# Patient Record
Sex: Male | Born: 1955 | Race: Black or African American | Hispanic: No | Marital: Single | Smoking: Current every day smoker
Health system: Southern US, Community
[De-identification: ages and names within clinical notes are randomized; demographics above are authoritative.]

## PROBLEM LIST (undated history)

## (undated) DIAGNOSIS — E0590 Thyrotoxicosis, unspecified without thyrotoxic crisis or storm: Secondary | ICD-10-CM

## (undated) DIAGNOSIS — I10 Essential (primary) hypertension: Secondary | ICD-10-CM

## (undated) HISTORY — PX: LYMPH NODE BIOPSY: SHX201

---

## 1999-11-23 ENCOUNTER — Encounter: Payer: Self-pay | Admitting: Internal Medicine

## 1999-11-23 ENCOUNTER — Encounter: Admission: RE | Admit: 1999-11-23 | Discharge: 1999-11-23 | Payer: Self-pay | Admitting: Internal Medicine

## 2000-01-04 ENCOUNTER — Other Ambulatory Visit: Admission: RE | Admit: 2000-01-04 | Discharge: 2000-01-04 | Payer: Self-pay | Admitting: General Surgery

## 2000-09-08 ENCOUNTER — Emergency Department (HOSPITAL_COMMUNITY): Admission: EM | Admit: 2000-09-08 | Discharge: 2000-09-08 | Payer: Self-pay | Admitting: Emergency Medicine

## 2001-07-03 ENCOUNTER — Emergency Department (HOSPITAL_COMMUNITY): Admission: EM | Admit: 2001-07-03 | Discharge: 2001-07-03 | Payer: Self-pay

## 2002-08-19 ENCOUNTER — Emergency Department (HOSPITAL_COMMUNITY): Admission: EM | Admit: 2002-08-19 | Discharge: 2002-08-19 | Payer: Self-pay | Admitting: *Deleted

## 2005-02-12 ENCOUNTER — Emergency Department (HOSPITAL_COMMUNITY): Admission: EM | Admit: 2005-02-12 | Discharge: 2005-02-13 | Payer: Self-pay | Admitting: Emergency Medicine

## 2005-02-23 ENCOUNTER — Emergency Department (HOSPITAL_COMMUNITY): Admission: EM | Admit: 2005-02-23 | Discharge: 2005-02-23 | Payer: Self-pay | Admitting: Emergency Medicine

## 2007-07-09 ENCOUNTER — Encounter: Admission: RE | Admit: 2007-07-09 | Discharge: 2007-07-09 | Payer: Self-pay | Admitting: Internal Medicine

## 2011-04-18 ENCOUNTER — Emergency Department (HOSPITAL_COMMUNITY): Payer: BC Managed Care – PPO

## 2011-04-18 ENCOUNTER — Observation Stay (HOSPITAL_COMMUNITY)
Admission: EM | Admit: 2011-04-18 | Discharge: 2011-04-19 | DRG: 143 | Disposition: A | Discharge status: Home / Self Care | Payer: BC Managed Care – PPO | Attending: Emergency Medicine | Admitting: Emergency Medicine

## 2011-04-18 DIAGNOSIS — F1010 Alcohol abuse, uncomplicated: Secondary | ICD-10-CM | POA: Diagnosis present

## 2011-04-18 DIAGNOSIS — R7309 Other abnormal glucose: Secondary | ICD-10-CM | POA: Diagnosis present

## 2011-04-18 DIAGNOSIS — Z79899 Other long term (current) drug therapy: Secondary | ICD-10-CM

## 2011-04-18 DIAGNOSIS — I10 Essential (primary) hypertension: Secondary | ICD-10-CM | POA: Diagnosis present

## 2011-04-18 DIAGNOSIS — F1210 Cannabis abuse, uncomplicated: Secondary | ICD-10-CM | POA: Diagnosis present

## 2011-04-18 DIAGNOSIS — F17200 Nicotine dependence, unspecified, uncomplicated: Secondary | ICD-10-CM | POA: Diagnosis present

## 2011-04-18 DIAGNOSIS — Z8249 Family history of ischemic heart disease and other diseases of the circulatory system: Secondary | ICD-10-CM

## 2011-04-18 DIAGNOSIS — R0789 Other chest pain: Principal | ICD-10-CM | POA: Diagnosis present

## 2011-04-18 LAB — POCT CARDIAC MARKERS
CKMB, poc: 10.4 ng/mL (ref 1.0–8.0)
Myoglobin, poc: 191 ng/mL (ref 12–200)
Troponin i, poc: <0.05 ng/mL (ref 0.00–0.09)

## 2011-04-18 LAB — LIPID PANEL
HDL: 61 mg/dL (ref >39)
Triglycerides: 59 mg/dL (ref <150)
VLDL: 12 mg/dL (ref 0–40)

## 2011-04-18 LAB — CARDIAC PANEL(CRET KIN+CKTOT+MB+TROPI)
CK, MB: 12.2 ng/mL (ref 0.3–4.0)
Relative Index: 1.2 (ref 0.0–2.5)
Troponin I: <0.3 ng/mL (ref <0.30)

## 2011-04-18 LAB — CBC
HCT: 45.9 % (ref 39.0–52.0)
MCV: 83 fL (ref 78.0–100.0)
RBC: 5.53 MIL/uL (ref 4.22–5.81)
WBC: 5.4 10*3/uL (ref 4.0–10.5)

## 2011-04-18 LAB — BASIC METABOLIC PANEL
BUN: 13 mg/dL (ref 6–23)
Calcium: 9.6 mg/dL (ref 8.4–10.5)
GFR calc non Af Amer: >60 mL/min (ref >60)
Glucose, Bld: 95 mg/dL (ref 70–99)
Potassium: 3.9 mEq/L (ref 3.5–5.1)
Sodium: 137 mEq/L (ref 135–145)

## 2011-04-18 LAB — DIFFERENTIAL
Basophils Absolute: 0 10*3/uL (ref 0.0–0.1)
Lymphocytes Relative: 31 % (ref 12–46)
Lymphs Abs: 1.7 10*3/uL (ref 0.7–4.0)
Neutro Abs: 3.1 10*3/uL (ref 1.7–7.7)
Neutrophils Relative %: 57 % (ref 43–77)

## 2011-04-19 DIAGNOSIS — R0789 Other chest pain: Secondary | ICD-10-CM

## 2011-04-19 DIAGNOSIS — R7989 Other specified abnormal findings of blood chemistry: Secondary | ICD-10-CM

## 2011-04-19 LAB — BASIC METABOLIC PANEL
BUN: 19 mg/dL (ref 6–23)
CO2: 23 mEq/L (ref 19–32)
Chloride: 104 mEq/L (ref 96–112)
Creatinine, Ser: 0.77 mg/dL (ref 0.4–1.5)
Glucose, Bld: 93 mg/dL (ref 70–99)
Potassium: 3.8 mEq/L (ref 3.5–5.1)

## 2011-04-19 LAB — HEMOGLOBIN A1C: Hgb A1c MFr Bld: 6.3 % — ABNORMAL HIGH (ref <5.7)

## 2011-04-19 LAB — CARDIAC PANEL(CRET KIN+CKTOT+MB+TROPI)
CK, MB: 9.5 ng/mL (ref 0.3–4.0)
Total CK: 643 U/L — ABNORMAL HIGH (ref 7–232)
Troponin I: <0.3 ng/mL (ref <0.30)
Troponin I: <0.3 ng/mL (ref <0.30)

## 2011-04-19 LAB — TSH: TSH: <0.008 u[IU]/mL — ABNORMAL LOW (ref 0.350–4.500)

## 2011-04-19 LAB — CBC
MCH: 28.7 pg (ref 26.0–34.0)
MCV: 84.2 fL (ref 78.0–100.0)
Platelets: 226 10*3/uL (ref 150–400)
RBC: 5.06 MIL/uL (ref 4.22–5.81)
RDW: 13.7 % (ref 11.5–15.5)

## 2011-04-19 LAB — RAPID URINE DRUG SCREEN, HOSP PERFORMED
Amphetamines: NOT DETECTED
Barbiturates: NOT DETECTED
Benzodiazepines: NOT DETECTED
Tetrahydrocannabinol: POSITIVE — AB

## 2011-04-19 NOTE — H&P (Signed)
Ralph Bradshaw, Ralph Bradshaw           ACCOUNT NO.:  [id number]  MEDICAL RECORD NO.:  [id number]           PATIENT TYPE:  E  LOCATION:  [facility]                         FACILITY:  [facility]  PHYSICIAN:  Clydia Llano, MD       DATE OF BIRTH:  11-26-1956  DATE OF ADMISSION:  04/18/2011 DATE OF DISCHARGE:                             HISTORY & PHYSICAL   PRIMARY CARE PHYSICIAN:  [facility].  REASON FOR ADMISSION:  Chest pain.  HISTORY OF PRESENT ILLNESS:  Mr. Ralph Bradshaw is a 55 year old African American male with no significant past medical history.  The patient presented to the hospital with chest pain.  The patient had chest pain on and off for the past few days.  Today the chest pressure did not go off.  It is more constant.  It is 5/6 substernal.  He did not feel anything increases or decrease the pain.  It is not exertional.  The pain when he came into the ED was 1/10.  When I interviewed him on pain, he is chest pain free.  The patient denies any shortness of breath. Denies syncope, diaphoresis, cough, or wheezes.  Upon initial evaluation in the emergency department, the patient's CK-MB is slightly positive at 10.4 with EKG still negative.  PAST MEDICAL HISTORY:  Hypertension.  SOCIAL HISTORY:  The patient lives in [place] here.  He lives in the same house with his mother who does have congestive heart failure and he cares for her.  The patient smokes 1 pack per day for the past 30 years, is not a chronic drinker, has a remote history of recreational drug use.  FAMILY HISTORY:  Mother has congestive heart failure.  She is still alive at 17.  Father died at 69 secondary to heart problems.  ALLERGIES:  NKDA.  MEDICATIONS:  Unable to remember medications.  REVIEW OF SYSTEMS:  GENERAL:  Negative for fever.  EYES:  Denies visual changes.  ENT:  Denies blurry vision.  CARDIOVASCULAR:  Positive for chest pain.  RESPIRATORY:  Denies cough, wheezes.  GASTROINTESTINAL: Denies  nausea, vomiting, or diarrhea.  MUSCULOSKELETAL:  Denies arthralgia.  SKIN:  Denies rash.  NEURO:  Denies headache.  HEMATOLOGY: Denies easy bruising or recent blood transfusion.  GU:  Denies dysuria or urgency.  PHYSICAL EXAMINATION:  VITAL SIGNS:  Temperature is 98.4, respirations 20, pulse 90, blood pressure 130/84. GENERAL:  The patient is a well-developed, well-nourished African American male, wearing hospital gown, lying on his back in no acute distress. HEENT:  Head and face:  Normocephalic, atraumatic.  Eyes:  Pupils are equal, reactive to light and accommodation.  Normal sclerae.  Ears, nose and throat:  Normal.  Mouth without oral thrush or lesions. NECK:  Supple, full range of motion.  No meningeal signs.  No JVD appreciated. CARDIOVASCULAR:  Regular rate and rhythm.  No murmur, rubs, or gallops. RESPIRATORY:  Clear to auscultation bilaterally. CHEST:  Nontender, symmetrical to breathing bilaterally. ABDOMEN:  Bowel sounds heard.  Soft, nontender, or distended.  No hepatosplenomegaly appreciated. EXTREMITIES:  Normal.  No deformities.  No pedal edema. NEURO:  Alert, awake, oriented x3.  Cranial nerves II-XII  grossly intact.  Motor intact in all extremities. SKIN:  Color normal.  No rash.  Warm and dry. PSYCH:  Alert, awake, oriented x4.  No abnormality of mood or affect.  Cardiac EKG showed normal sinus rhythm rate at 89.  Chest x-ray showed no active lung disease, questionable chronic bronchitis.  LABORATORY DATA: 1. BMP:  Sodium 137, potassium 3.9, chloride 101, bicarb is 24,     glucose 95, BUN is 13, creatinine is 0.8, calcium 9.6. 2. CBC:  WBC is 5.4, hemoglobin 16.1, hematocrit is 45.9, platelets     256,000. 3. Cardiac enzymes:  CK-MB is 10.4, troponin less than 0.05, myoglobin     is 191.  ASSESSMENT/PLAN: 1. Chest pain.  The patient will be referred to observation overnight.     He will be admitted to cardiac telemetry bed.  Serial cardiac     enzymes will  be obtained, also repeat of the EKG in the morning.     The patient because of slightly positive CK-MB will be staying in     the hospital.  The patient also has positive family history of     premature coronary artery disease.  We will check fasting lipid     profile. 2. Hypertension.  Continue home medications.  Hypertension seems     controlled now.     Clydia Llano, MD     ME/MEDQ  D:  04/18/2011  T:  04/18/2011  Job:  [id number]  cc:   [facility] Roderic Palau R. Renae Gloss, M.D.  Electronically Signed by Clydia Llano  on 04/19/2011 12:44:33 PM

## 2011-04-20 ENCOUNTER — Ambulatory Visit (INDEPENDENT_AMBULATORY_CARE_PROVIDER_SITE_OTHER): Payer: BC Managed Care – PPO | Admitting: Internal Medicine

## 2011-04-20 DIAGNOSIS — R079 Chest pain, unspecified: Secondary | ICD-10-CM

## 2011-04-20 NOTE — Progress Notes (Signed)
Exercise Treadmill Test  Pre-Exercise Testing Evaluation Rhythm: normal sinus  Rate: 88   PR:  .17 QRS:  .09  QT:  .36 QTc: .44     Test  Exercise Tolerance Test Ordering MD: Arvilla Meres, MD  Interpreting MD:  Arvilla Meres, MD  Unique Test No: 1  Treadmill:  1  Indication for ETT: chest pain - rule out ischemia  Contraindication to ETT: No   Stress Modality: exercise - treadmill  Cardiac Imaging Performed: non   Protocol: standard Bruce - maximal  Max BP:  202/98  Max MPHR (bpm):  167 141  MPHR obtained (bpm):  144 % MPHR obtained:  86  Reached 85% MPHR (min:sec):  9:30 Total Exercise Time (min-sec):  10:30  Workload in METS:  13.4 Borg Scale: 15  Reason ETT Terminated:  fatigue    ST Segment Analysis At Rest: normal ST segments - no evidence of significant ST depression With Exercise: no evidence of significant ST depression  Other Information Arrhythmia:  No Angina during ETT:  absent (0) Quality of ETT:  diagnostic  ETT Interpretation:  normal - no evidence of ischemia by ST analysis  Comments: Stage I advanced after 1:30 so total Bruce time 12:00. Normal exercise stress test with no evidence of ischemia. Excellent exercise tolerance. Mild HTN response to exercise.  Recommendations: Risk factor modification.

## 2011-05-09 NOTE — Consult Note (Signed)
[name], Ralph Bradshaw           ACCOUNT NO.:  [id number]  MEDICAL RECORD NO.:  [id number]           PATIENT TYPE:  I  LOCATION:  3704                         FACILITY:  [facility]  PHYSICIAN:  Ralph Bradshaw. Ralph Bradshaw, MDDATE OF BIRTH:  12-09-1956  DATE OF CONSULTATION:  04/19/2011 DATE OF DISCHARGE:  04/19/2011                                CONSULTATION   PRIMARY CARE PHYSICIAN:  Ralph Bradshaw (PA for Ralph Bradshaw. Ralph Gloss, MD).  The patient is new to Cardiology.  REASON FOR CONSULTATION:  Chest discomfort with question of elevated cardiac enzymes.  HISTORY OF PRESENT ILLNESS:  Mr. Ralph Bradshaw is a 55 year old African American male with no cardiac history, but risk factors including hypertension, ongoing tobacco abuse, and question of positive family history, who presents with chest pain over the last 2 weeks, subsequently found to have significantly elevated CK and mildly elevated MB without positive relative index, normal troponins, and no EKG changes.  The patient lives in his usual state of health until approximately 2 weeks prior to his presentation when he noticed intermittent substernal chest pressure, lasting a few minutes that would resolve on its own.  No clear aggravating or alleviating factors.  The chest pain progressed into a near constant discomfort, lasting approximately last week prior to his presentation.  Again, no clear aggravating or alleviating factors, also no associated symptoms.  The patient then began to notice sharp shooting chest pain, lasting a fraction of a second and became concerned enough about these symptoms to present to his primary care physician, PA, Ralph Bradshaw, who completed an EKG without acute changes, but was concerned enough to feel the patient require evaluation in the emergency department.  The patient did present to [facility] ED where he was subsequently admitted and cardiac enzymes have been cycled x2 full sets. Initial set shows CK of 1027 with MB  of 12.2 and relative index of 1.2, troponin less than 0.30.  Second full set showed CK 682 with MB of 9.0 and troponin down to less than 0.30 again.  EKG was completed on admission that shows likely left ventricular hypertrophy but no significant ST-T-wave changes or significant Q-waves, normal axis and normal intervals.  The patient's vital signs have been stable, notable only for mild hypertension with peak systolic at 155 and peak diastolic at 90.  Review of telemetry shows normal sinus rhythm with a rare PVC. Chest x-ray shows chronic bronchitis.  Labs only remarkable other than cardiac enzymes for HbA1c of 6.3%, TSH low at 0.008, and UDS positive for tetrahydrocannabinol.  Lipids with mildly elevated LDL at 111, otherwise within normal limits.  Currently, the patient says his chest discomfort is gone although he still has the sensation present, but no actual discomfort.  The patient does a very physical job and has had no aggravation or alleviation to his chest discomfort at work.  He does note that work has been especially busy starting on to the first week in April and has contributed to increase stress along with some social stresses.  PAST MEDICAL HISTORY: 1. Hypertension (moderate control on 1 medication only). 2. Ongoing tobacco abuse (30 pack years, 1 ppd currently). 3.  EtOH abuse up to 12 drinks a day at least once per week. 4. Marijuana use (UDS positive for marijuana).  SOCIAL HISTORY:  The patient lives in [place] with his mom.  He has a son.  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  He has 30-pack-year smoking history, currently smoking 1 ppd, frequent EtOH use with 12 drinks per day at least once per week. Marijuana use on occasion.  No other illicit drugs.  No herbal meds. Regular diet.  No regular exercise.  FAMILY HISTORY:  Mother is living at age 71 with congestive heart failure.  Father deceased at 35 with some heart  problem, question exact cardiac issue.  No other significant family history.  REVIEW OF SYSTEMS:  Please see HPI.  All other systems were reviewed and were negative.  Specifically, no exertional chest pain, no shortness of breath, no dyspnea on exertion, no orthopnea, no lower extremity edema, no palpitations, no pre/syncope, no nausea/vomiting or abdominal discomfort, no fevers/chills, or any other recent changes.  CODE STATUS:  Full.  ALLERGIES:  NKDA.  MEDICATIONS: 1. Enteric-coated aspirin 325 mg p.o. daily. 2. Toprol 25 mg p.o. daily. 3. P.r.n. meds.  PHYSICAL EXAMINATION:  VITAL SIGNS:  T-max 98.3 degrees Fahrenheit, current temperature 97.6 degrees Fahrenheit, with BP ranging from 128- 155/79-90, with pulse 74-88, respiration rate 18, O2 saturation 99% on 2 liters nasal cannula. GENERAL:  He is alert and oriented x3, in no apparent stress, able to speak very easily in full sentences without respiratory distress, able to move during exam easily without respiratory distress. HEENT:  Head is normocephalic, atraumatic.  Pupils equal, round, and reactive to light.  Extraocular muscles are intact.  Dentition is fair. Oropharynx is without erythema or exudates. NECK:  Supple without lymphadenopathy.  No thyromegaly, no JVD. HEART:  Rate is regular with audible S1 and S2.  No clicks, rubs, murmurs, or gallops.  Pulses are 2+ and equal in both upper and lower extremities bilaterally. LUNGS:  Clear to auscultation bilaterally. SKIN:  No rashes, lesions, or petechiae. ABDOMEN:  Soft, nontender, nondistended.  Normal abdominal bowel sounds. No rebound or guarding. EXTREMITIES:  Without clubbing, cyanosis, or edema. MUSCULOSKELETAL:  Without joint deformity or effusions.  No spinal or CVA tenderness.  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 NEUROLOGIC: Cranial nerves II-XII grossly intact.  Strength is 5/5 in all  extremities and axial groups.  Normal sensation throughout and normal cerebellar function.  RADIOLOGY:  Question chronic, question bronchitis.  No acute changes. EKG, normal sinus rhythm with a rate of 89.  No significant ST-T-wave changes, question Q-wave in V1, question significance.  Normal axis. Left ventricular hypertrophy, PR 160, QRS 96, and QTc of 440 when compared to a prior tracing from February 12, 1999 ? exact year, no significant change except for rate has decreased.  LABS:  WBC is 4.4, HGB 14.5, HCT 42.6, PLT count 226.  Sodium 137, potassium 3.8, chloride 104, bicarb 23, BUN 19, creatinine 0.77, glucose 93.  Hemoglobin A1c 6.3%.  Point-of-care markers negative x1.  First full set of cardiac enzymes; CK 1027, MB 12.2, troponin less than 0.3. Second full set; CK 682, MB 9.0, troponin I less than 0.3.  Positive for tetrahydrocannabinol.  Total cholesterol 184, triglycerides 59, HDL 61, LDL 111.  ASSESSMENT AND PLAN:  The patient was initially seen by Lenard Galloway, PA- C,  and discussed but not yet seen and thoroughly assessed by attending cardiologist, Dr. Arvilla Meres.  Please see his addendum for any significant change to assessment and plan.  Mr. Trompeter is a 55 year old African American male with no known cardiac history but risk factors of hypertension, ongoing tobacco abuse, question positive family history, also history significant for EtOH abuse and marijuana use, who presents with atypical chest discomfort with no objective evidence of cardiac etiology.  1. Chest pain.  The patient's chest pain was some typical, mostly     atypical features.  No objective evidence of cardiac etiology,     specifically significant elevated CK but with negative relative     index in regard to the mildly elevated MB levels.  Troponin I is     negative x3 including point-of-cares.  EKG without acute changes.     The patient has very physical work that has been increasing      recently and could account for his elevated CK.  The patient also     has EtOH abuse which could be potential source of gastrointestinal     chest discomfort.  At any rate, I would recommend outpatient     exercise treadmill stress test.  We will review with Dr. Gala Romney     prior to scheduling. 2. Tobacco abuse - discussed with the patient the vital importance of     tobacco cessation both in regard to long-term help and short term     well-being.  While the patient very likely does not have     significant coronary artery disease currently, he has been informed     that it will be amount of time should he continued to smoke. 3. EtOH abuse.  The patient was instructed/informed as to the     importance of EtOH cessation.  It is clear that at the level of     alcohol consumption that he admits to the patient has significant     reliance on EtOH as a coping mechanism.  The patient instructed as     to the futility of attempts at moderation given his history.  The     patient was offered support and suggestions for success with     abstinence.  The patient is open to alcoholic anonymous and he is     willing to consider EtOH cessation. 4. Depressed TSH - per primary per primary team, would follow up as an     outpatient with repeat TSH, free T4, and free T3.  ADDENDUM:  Case discussed with Dr. Gala Romney who feels the patient does have some typical features and discussed the option of cardiac catheterization with the patient which he refused.  Dr. Gretchen Portela in this setting to proceed with outpatient exercise treadmill stress test and insist this be done as soon as possible and therefore I will make sure this is done tomorrow.  Thank you for the consult.  The patient may be discharged home per Cardiology with date and time of stress test written in the discharge sheet.     Ralph Bradshaw, PAC   ______________________________ Ralph Bradshaw. Ka Flammer, MD    MS/MEDQ  D:   04/19/2011  T:  04/20/2011  Job:  [id number]  Electronically Signed by Ralph Bradshaw PAC on 04/25/2011 01:25:05 PM Electronically Signed by Arvilla Meres MD on 05/09/2011 09:36:24 PM

## 2011-07-13 NOTE — Discharge Summary (Signed)
NAMELORANCE, PICKERAL           ACCOUNT NO.:  [id number]  MEDICAL RECORD NO.:  [id number]           PATIENT TYPE:  O  LOCATION:  3704                         FACILITY:  [facility]  PHYSICIAN:  Baltazar Najjar, MD     DATE OF BIRTH:  Nov 26, 1956  DATE OF ADMISSION:  04/18/2011 DATE OF DISCHARGE:                              DISCHARGE SUMMARY   FINAL DISCHARGE DIAGNOSES: 1. Chest pain with elevated cardiac enzymes. 2. Hypertension. 3. History of hyperthyroidism. 4. Borderline diabetes mellitus type 2.  CONSULTATION:  [facility].  The patient was seen by Dr. Gala Romney.  RADIOLOGY/IMAGING:  Chest x-ray showed no active lung disease, questionable bronchitis, possibly chronic.  BRIEF ADMITTING HISTORY:  Please refer to H and P for more details.  On summary Ralph Bradshaw is a 55 year old man with history of hypertension and he is a smoker, presented to the ER with chest pain which he described as substernal, current pain 5/6.  Improving with nitroglycerin.  HOSPITAL COURSE: 1. The patient's EKG was unremarkable, however, his CK and CK-MB were     elevated.  Cardiology Service was consulted.  The patient was seen     by [facility], who recommended stress test as an outpatient     which was arranged for tomorrow and they cleared the patient for     discharge to follow with them in the office in the a.m. 2. Hypertension.  The patient was continued on his outpatient     metoprolol.  His blood pressure is fairly controlled. 3. Borderline diabetes mellitus type 2.  His hemoglobin A1c is 6.3%.     The patient advised to stay on low-carbohydrate diet and to follow     with his PCP for monitoring and any further management of diabetes,     if not improved by diet alone. 4. Hyperthyroidism.  The patient's labs showed low TSH.  His level was     less than 0.008, however, the patient is completely asymptomatic.     As per him, he was taking medication in the past for his  thyroid.     He was unable to specify the name and he stated that his doctor     took him off those medicines.  I will defer any further management     to his PCP.  He will probably need workup for his hyperthyroidism     including free T3 and T4 level and I will defer that to his PCP.     The patient is currently asymptomatic.  DISCHARGE MEDICATIONS: 1. Aspirin 325 mg p.o. daily. 2. Metoprolol 25 mg p.o. daily.  DISCHARGE INSTRUCTIONS: 1. The patient to follow with Dr. Gala Romney at [facility]     tomorrow as arranged.  He has an appointment at 10:15 a.m. 2. The patient to follow with Dr. [name], his PCP for further     workup and management for his hyperparathyroidism and also to     monitor his borderline diabetes. 3. The patient counseled on diet and advised to stay on low-carb     hydrate diet and he was also counseled on smoking cessation and  other lifestyle modification. 4. The patient advised to report any worsening of symptoms or any new     symptoms to his PCP or come back to the ED if needed.  CONDITION ON DISCHARGE:  Stable.          ______________________________ Baltazar Najjar, MD     SA/MEDQ  D:  04/19/2011  T:  04/19/2011  Job:  [id number]  cc:   [facility] [name]  Electronically Signed by Hannah Beat MD on 07/13/2011 02:40:25 PM

## 2014-12-07 ENCOUNTER — Encounter: Payer: Self-pay | Admitting: Internal Medicine

## 2019-06-04 ENCOUNTER — Encounter (HOSPITAL_COMMUNITY): Payer: Self-pay | Admitting: Emergency Medicine

## 2019-06-04 ENCOUNTER — Emergency Department (HOSPITAL_COMMUNITY)
Admission: EM | Admit: 2019-06-04 | Discharge: 2019-06-04 | Disposition: A | Discharge status: Home / Self Care | Payer: BC Managed Care – PPO | Attending: Emergency Medicine | Admitting: Emergency Medicine

## 2019-06-04 DIAGNOSIS — J1289 Other viral pneumonia: Secondary | ICD-10-CM | POA: Diagnosis not present

## 2019-06-04 DIAGNOSIS — U071 COVID-19: Secondary | ICD-10-CM | POA: Diagnosis not present

## 2019-06-04 DIAGNOSIS — R509 Fever, unspecified: Secondary | ICD-10-CM | POA: Diagnosis present

## 2019-06-04 DIAGNOSIS — J189 Pneumonia, unspecified organism: Secondary | ICD-10-CM

## 2019-06-04 MED ORDER — AMOXICILLIN 500 MG PO CAPS: 500.0000 mg | ORAL_CAPSULE | Freq: Three times a day (TID) | ORAL | 0 refills | Status: DC

## 2019-06-04 MED ORDER — AZITHROMYCIN 250 MG PO TABS: 250.0000 mg | ORAL_TABLET | Freq: Every day | ORAL | 0 refills | Status: DC

## 2019-06-04 MED ORDER — ACETAMINOPHEN 325 MG PO TABS
650.0000 mg | ORAL_TABLET | Freq: Once | ORAL | Status: AC
  Administered 2019-06-04: 650 mg via ORAL
  Filled 2019-06-04: qty 2

## 2019-06-04 MED ORDER — ASPIRIN 81 MG PO CHEW: 81.0000 mg | CHEWABLE_TABLET | Freq: Every day | ORAL | Status: DC

## 2019-06-04 NOTE — ED Triage Notes (Signed)
TC to PT home left message that home meds will be at [facility] desk for pick up.

## 2019-06-04 NOTE — ED Triage Notes (Signed)
PT reports HA started last night . Pt reports he has felt hot mainly at night. Pt had a elevated temp at Triage.

## 2019-06-04 NOTE — ED Triage Notes (Signed)
[facility] arrives from [facility] after having a xray that showed bilateral infiltrates- pt was requesting a covid test. Pt denies any sob or chest pain

## 2019-06-04 NOTE — Discharge Instructions (Signed)
Tylenol/Ibuprofen for any fever.   Self isolate until all tests are back.  We will call with any positive results.

## 2019-06-04 NOTE — ED Notes (Signed)
Patient verbalizes understanding of discharge instructions . Opportunity for questions and answers were provided . Armband removed by staff ,Pt discharged from ED. W/C  offered at D/C  and Declined W/C at D/C and was escorted to lobby by RN.  

## 2019-06-04 NOTE — ED Provider Notes (Signed)
[facility] EMERGENCY DEPARTMENT Provider Note   CSN: [id number] Arrival date & time: 06/04/19  1305    History   Chief Complaint Chief Complaint  Patient presents with  . Fever    HPI Nathyn Inclan is a 63 y.o. male presenting to the ED requesting a covid test.   Pt states that for the past 3 days he has had subjective fever at night and then chills and myalgias during the day.   Today he developed a mild ha so he went to UC.   UC did a CXR that showed bilateral infiltrates and sent pt to ED for covid test.  Pt is heavy smoker and has PMH of HTN.   He is speaking in full sentences and in no distress.   He denies dizziness, neck pain, cp, shob, abd pain, n/v/d.   His spo2 is 96% on RA.       HPI  History reviewed. No pertinent past medical history.  There are no active problems to display for this patient.   History reviewed. No pertinent surgical history.      Home Medications    Prior to Admission medications   Medication Sig Start Date End Date Taking? Authorizing Provider  amoxicillin (AMOXIL) 500 MG capsule Take 1 capsule (500 mg total) by mouth 3 (three) times daily. 06/04/19   Kemia Wendel K, PA-C  aspirin 81 MG chewable tablet Chew 1 tablet (81 mg total) by mouth daily. 06/04/19   Di Jasmer K, PA-C  azithromycin (ZITHROMAX) 250 MG tablet Take 1 tablet (250 mg total) by mouth daily. Take first 2 tablets together, then 1 every day until finished. 06/04/19   Holger Sokolowski K, PA-C    Family History No family history on file.  Social History Social History   Tobacco Use  . Smoking status: Not on file  Substance Use Topics  . Alcohol use: Not on file  . Drug use: Not on file     Allergies   Patient has no allergy information on record.   Review of Systems Review of Systems  Constitutional: Positive for chills and fever.  HENT: Negative for congestion, ear pain, rhinorrhea, sinus pain and sore throat.   Eyes: Negative for pain and  discharge.  Respiratory: Negative for cough, chest tightness and shortness of breath.   Cardiovascular: Negative for chest pain.  Gastrointestinal: Negative for abdominal pain, diarrhea, nausea and vomiting.  Musculoskeletal: Positive for myalgias.  Skin: Negative for color change and rash.  Neurological: Positive for headaches. Negative for dizziness.     Physical Exam Updated Vital Signs BP (!) 149/86 (BP Location: Left Arm)   Pulse 96   Temp (!) 101 F (38.3 C) (Oral)   Resp 18   SpO2 99%   Physical Exam Vitals signs and nursing note reviewed.  Constitutional:      General: He is not in acute distress.    Appearance: Normal appearance. He is not diaphoretic.  HENT:     Head: Normocephalic and atraumatic.     Nose: Nose normal.     Mouth/Throat:     Mouth: Mucous membranes are moist.     Pharynx: Oropharynx is clear. No oropharyngeal exudate or posterior oropharyngeal erythema.  Eyes:     General:        Right eye: No discharge.        Left eye: No discharge.  Neck:     Musculoskeletal: Normal range of motion and neck supple. No neck rigidity or  muscular tenderness.  Cardiovascular:     Rate and Rhythm: Normal rate and regular rhythm.     Pulses: Normal pulses.     Heart sounds: Normal heart sounds.  Pulmonary:     Effort: Pulmonary effort is normal. No respiratory distress.     Breath sounds: No wheezing or rales.  Abdominal:     General: Abdomen is flat. There is no distension.     Palpations: Abdomen is soft.     Tenderness: There is no abdominal tenderness.  Skin:    General: Skin is warm.  Neurological:     General: No focal deficit present.     Mental Status: He is alert and oriented to person, place, and time.     Coordination: Coordination normal.  Psychiatric:        Mood and Affect: Mood normal.        Behavior: Behavior normal.      ED Treatments / Results  Labs (all labs ordered are listed, but only abnormal results are displayed) Labs  Reviewed  NOVEL CORONAVIRUS, NAA (HOSPITAL ORDER, SEND-OUT TO REF LAB)    EKG None  Radiology No results found.  Procedures Procedures (including critical care time)  Medications Ordered in ED Medications  acetaminophen (TYLENOL) tablet 650 mg (650 mg Oral Given 06/04/19 1441)     Initial Impression / Assessment and Plan / ED Course  I have reviewed the triage vital signs and the nursing notes.  Pertinent labs & imaging results that were available during my care of the patient were reviewed by me and considered in my medical decision making (see chart for details).  Pt has been resting here;   He is not in any respiratory distress.   His spo2 has remained 96-97% on RA.   UC did not prescribe the pt any medications.   Will add antibiotics for pts bilateral infiltrates.   Will send out covid test for outpatient testing.  Pts PE is also unremarkable.   There is no clinical indication for further.   Pt advised about isolation and return precautions.  Final Clinical Impressions(s) / ED Diagnoses   Final diagnoses:  Community acquired pneumonia, unspecified laterality    ED Discharge Orders         Ordered    azithromycin (ZITHROMAX) 250 MG tablet  Daily     06/04/19 1454    amoxicillin (AMOXIL) 500 MG capsule  3 times daily     06/04/19 1454    aspirin 81 MG chewable tablet  Daily     06/04/19 1454           Marlyss Cissell K, PA-C 06/04/19 1455    Charlynne PanderYao, David Hsienta, MD 06/05/19 1126

## 2019-06-08 ENCOUNTER — Telehealth: Payer: Self-pay

## 2019-06-08 LAB — NOVEL CORONAVIRUS, NAA (HOSP ORDER, SEND-OUT TO REF LAB; TAT 18-24 HRS): SARS-CoV-2, NAA: DETECTED — AB

## 2019-06-08 NOTE — Telephone Encounter (Signed)
Pt. Given COVID 19 positive results. States he is feeling better. Lives with a room mate. Reviewed home safety - wearing masks, limiting contact with people. Wash clothes separately. Use separate dishes. States he has a PCP and will notify him. Will self-quarantine x 10 days from beginning of symptoms and 3 days fever free. Verbalizes understanding.

## 2020-02-19 ENCOUNTER — Ambulatory Visit: Payer: Self-pay | Attending: Internal Medicine

## 2020-02-19 DIAGNOSIS — Z23 Encounter for immunization: Secondary | ICD-10-CM | POA: Insufficient documentation

## 2020-02-19 NOTE — Progress Notes (Signed)
   Covid-19 Vaccination Clinic  Name:  Ralph Bradshaw    MRN: [id number] DOB: 20-Sep-1956  02/19/2020  Ralph Bradshaw was observed post Covid-19 immunization for 15 minutes without incident. He was provided with Vaccine Information Sheet and instruction to access the V-Safe system.   Ralph Bradshaw was instructed to call 911 with any severe reactions post vaccine: Marland Kitchen Difficulty breathing  . Swelling of face and throat  . A fast heartbeat  . A bad rash all over body  . Dizziness and weakness

## 2020-03-22 ENCOUNTER — Ambulatory Visit: Payer: Self-pay | Attending: Internal Medicine

## 2020-03-22 ENCOUNTER — Ambulatory Visit: Payer: Self-pay

## 2020-03-22 DIAGNOSIS — Z23 Encounter for immunization: Secondary | ICD-10-CM

## 2020-03-22 NOTE — Progress Notes (Signed)
   Covid-19 Vaccination Clinic  Name:  Ralph Bradshaw    MRN: [id number] DOB: 12-24-1955  03/22/2020  [name] was observed post Covid-19 immunization for 15 minutes without incident. He was provided with Vaccine Information Sheet and instruction to access the V-Safe system.   [name] was instructed to call 911 with any severe reactions post vaccine: Marland Kitchen Difficulty breathing  . Swelling of face and throat  . A fast heartbeat  . A bad rash all over body  . Dizziness and weakness   Immunizations Administered    Name Date Dose VIS Date Route   Pfizer COVID-19 Vaccine 03/22/2020  2:41 PM 0.3 mL 11/27/2019 Intramuscular   Manufacturer: [employer], [employer]   Lot: [id number]   NDC: [address]-4

## 2020-04-24 ENCOUNTER — Ambulatory Visit (HOSPITAL_COMMUNITY)
Admission: EM | Admit: 2020-04-24 | Discharge: 2020-04-24 | Disposition: A | Discharge status: Home / Self Care | Payer: BLUE CROSS/BLUE SHIELD

## 2020-04-24 ENCOUNTER — Ambulatory Visit (INDEPENDENT_AMBULATORY_CARE_PROVIDER_SITE_OTHER): Payer: BLUE CROSS/BLUE SHIELD

## 2020-04-24 ENCOUNTER — Other Ambulatory Visit: Payer: Self-pay

## 2020-04-24 ENCOUNTER — Encounter (HOSPITAL_COMMUNITY): Payer: Self-pay | Admitting: Urgent Care

## 2020-04-24 DIAGNOSIS — M4312 Spondylolisthesis, cervical region: Secondary | ICD-10-CM

## 2020-04-24 DIAGNOSIS — E0590 Thyrotoxicosis, unspecified without thyrotoxic crisis or storm: Secondary | ICD-10-CM

## 2020-04-24 DIAGNOSIS — M5030 Other cervical disc degeneration, unspecified cervical region: Secondary | ICD-10-CM

## 2020-04-24 DIAGNOSIS — R030 Elevated blood-pressure reading, without diagnosis of hypertension: Secondary | ICD-10-CM

## 2020-04-24 DIAGNOSIS — I10 Essential (primary) hypertension: Secondary | ICD-10-CM

## 2020-04-24 DIAGNOSIS — M5412 Radiculopathy, cervical region: Secondary | ICD-10-CM

## 2020-04-24 DIAGNOSIS — M25512 Pain in left shoulder: Secondary | ICD-10-CM

## 2020-04-24 DIAGNOSIS — M47812 Spondylosis without myelopathy or radiculopathy, cervical region: Secondary | ICD-10-CM

## 2020-04-24 DIAGNOSIS — M542 Cervicalgia: Secondary | ICD-10-CM

## 2020-04-24 DIAGNOSIS — R200 Anesthesia of skin: Secondary | ICD-10-CM

## 2020-04-24 HISTORY — DX: Thyrotoxicosis, unspecified without thyrotoxic crisis or storm: E05.90

## 2020-04-24 HISTORY — DX: Essential (primary) hypertension: I10

## 2020-04-24 MED ORDER — METHYLPREDNISOLONE ACETATE 80 MG/ML IJ SUSP
INTRAMUSCULAR | Status: AC
Start: 2020-04-24 — End: ?
  Filled 2020-04-24: qty 1

## 2020-04-24 MED ORDER — TRAMADOL HCL 50 MG PO TABS: 50.0000 mg | ORAL_TABLET | Freq: Four times a day (QID) | ORAL | 0 refills | Status: AC | PRN

## 2020-04-24 MED ORDER — METHYLPREDNISOLONE ACETATE 80 MG/ML IJ SUSP
80.0000 mg | Freq: Once | INTRAMUSCULAR | Status: AC
  Administered 2020-04-24: 12:00:00 80 mg via INTRAMUSCULAR

## 2020-04-24 NOTE — ED Triage Notes (Signed)
Pt c/o 10/10 sharp left shoulder pain upon wsking 3 days ago. Pt denies injury. Pt can only lift left arm up halfway due to pain. Pt states he has some numbness in his left arm. Pt has 2+ left radial pulse, warm to touch, 5/5 left hand grip strength.

## 2020-04-24 NOTE — Discharge Instructions (Signed)
Please schedule Tylenol at 500 mg - 650 mg once every 6 hours as needed for aches and pains.  If you still have pain despite taking Tylenol regularly, this is breakthrough pain.  You can use tramadol once every 6 hours for this.  Once your pain is better controlled, switch back to just Tylenol.  

## 2020-04-24 NOTE — ED Provider Notes (Signed)
[facility]   MRN: [id number] DOB: 03/01/1956  Subjective:   Ralph Bradshaw is a 64 y.o. male presenting for 3-day history of acute onset moderate to severe constant persistent pain along the base of his neck that radiates into his left trapezius and anteriorly to his clavicle and deltoids.  He has also started to feel some tingling sensation of his left arm.  Patient states that he performs very strenuous work activities, works in a [employer].  He has a history of hypothyroidism and hypertension.  Takes blood pressure medication and methimazole.  Denies history of MI but did have an episode of chest pain that had full work-up for in 2012.  Denies history of heart disease, heart failure.  He is a smoker, smokes about half pack per day.  Denies headache, confusion, chest pain, shortness of breath, heart racing, nausea, vomiting, belly pain, diaphoresis.  Has only used Tylenol for his pain.  No current facility-administered medications for this encounter.  Current Outpatient Medications:  .  methimazole (TAPAZOLE) 10 MG tablet, Take 10 mg by mouth 2 (two) times daily., Disp: , Rfl:  .  metoprolol succinate (TOPROL-XL) 25 MG 24 hr tablet, Take 25 mg by mouth daily., Disp: , Rfl:    No Known Allergies  Past Medical History:  Diagnosis Date  . Hypertension   . Hyperthyroidism      Past Surgical History:  Procedure Laterality Date  . LYMPH NODE BIOPSY Left    shoulder    Family History  Problem Relation Age of Onset  . Diabetes Mother     Social History   Tobacco Use  . Smoking status: Current Every Day Smoker    Packs/day: 0.50    Types: Cigarettes  . Smokeless tobacco: Never Used  Substance Use Topics  . Alcohol use: Yes    Alcohol/week: 7.0 standard drinks    Types: 7 Cans of beer per week  . Drug use: Never    ROS   Objective:   Vitals: BP (!) 163/85   Pulse 95   Temp 98.1 F (36.7 C) (Oral)   Resp 18   Ht 5\' 7"  (1.702 m)   Wt 195 lb  (88.5 kg)   SpO2 97%   BMI 30.54 kg/m   Physical Exam Constitutional:      General: He is not in acute distress.    Appearance: Normal appearance. He is well-developed. He is not ill-appearing, toxic-appearing or diaphoretic.  HENT:     Head: Normocephalic and atraumatic.     Right Ear: External ear normal.     Left Ear: External ear normal.     Nose: Nose normal.     Mouth/Throat:     Mouth: Mucous membranes are moist.     Pharynx: Oropharynx is clear.  Eyes:     General: No scleral icterus.    Extraocular Movements: Extraocular movements intact.     Pupils: Pupils are equal, round, and reactive to light.  Neck:     Vascular: No carotid bruit.  Cardiovascular:     Rate and Rhythm: Normal rate and regular rhythm.     Heart sounds: Normal heart sounds. No murmur. No friction rub. No gallop.   Pulmonary:     Effort: Pulmonary effort is normal. No respiratory distress.     Breath sounds: Normal breath sounds. No stridor. No wheezing, rhonchi or rales.  Musculoskeletal:     Left shoulder: Tenderness present. No swelling, deformity, effusion, laceration, bony tenderness or crepitus. Decreased  range of motion (Especially with abducting greater than 90 degrees as it elicits pain of his trapezius and neck). Normal strength. Normal pulse.     Left upper arm: No swelling, edema, deformity, lacerations, tenderness or bony tenderness.     Left forearm: No swelling, edema, deformity, lacerations, tenderness or bony tenderness.     Left hand: No swelling, deformity, lacerations, tenderness or bony tenderness. Normal range of motion. Normal strength.       Arms:     Cervical back: Spasms (Severe spasm of left trapezius with exquisite tenderness) and tenderness present. No swelling, edema, deformity, erythema, signs of trauma, lacerations, rigidity, torticollis, bony tenderness or crepitus. Pain with movement present. Decreased range of motion (Especially rotation).       Back:  Neurological:       Mental Status: He is alert and oriented to person, place, and time.  Psychiatric:        Mood and Affect: Mood normal.        Behavior: Behavior normal.        Thought Content: Thought content normal.        Judgment: Judgment normal.     DG Cervical Spine Complete  Result Date: 04/24/2020 CLINICAL DATA:  Pain radicular symptoms EXAM: CERVICAL SPINE - COMPLETE 4+ VIEW COMPARISON:  February 12, 2005 FINDINGS: Frontal, lateral, open-mouth odontoid, and bilateral oblique views were obtained. There is no demonstrable fracture. There is 2 mm of retrolisthesis of C5 on C6. No other spondylolisthesis is evident. Prevertebral soft tissues and predental space regions are normal. There is moderately severe disc space narrowing at C5-6 and C6-7. There are anterior osteophytes at C5, C6, C7. There are foci of calcification in the anterior ligament at C2-3, C4-5, and C6-7. There is facet hypertrophy with exit foraminal narrowing at C3-4 bilaterally, at C4-5 on the left, at C5-6 bilaterally, and at C6-7 bilaterally, more severe on the left than on the right. Lung apices are clear. IMPRESSION: Multilevel arthropathy, most severe at C5-6 and C6-7. Mild spondylolisthesis at C5-6 is felt to be due to underlying spondylosis. No other spondylolisthesis. No evident fracture. Electronically Signed   By: Bretta Bang III M.D.   On: 04/24/2020 11:58    ED ECG REPORT   Date: 04/24/2020  Rate: 88bpm  Rhythm: normal sinus rhythm  QRS Axis: normal  Intervals: normal  ST/T Wave abnormalities: nonspecific T wave changes  Conduction Disutrbances:none  Narrative Interpretation: T wave flattening in lead III, otherwise sinus rhythm and no acute changes.  His EKG today looks improved from his EKG in 2012.  Old EKG Reviewed: changes noted  I have personally reviewed the EKG tracing and agree with the computerized printout as noted.   Assessment and Plan :   PDMP not reviewed this encounter.  1. Cervical  radiculopathy   2. Neck pain   3. Acute pain of left shoulder   4. Left arm numbness   5. Essential hypertension   6. Elevated blood pressure reading   7. Hyperthyroidism   8. Degenerative disc disease, cervical   9. Spondylolisthesis of cervical region   10. Cervical spondylosis without myelopathy     IM Depo-Medrol in clinic.  Schedule Tylenol and use tramadol for breakthrough pain.  Recommended patient follow-up with neurosurgery ASAP.  No signs of ACS at this time.  Maintain medication regimen and follow-up with PCP.  Counseled patient on potential for adverse effects with medications prescribed/recommended today, ER and return-to-clinic precautions discussed, patient verbalized understanding.  Jaynee Eagles, PA-C 04/24/20 1349

## 2020-05-02 ENCOUNTER — Other Ambulatory Visit: Payer: Self-pay

## 2020-05-02 ENCOUNTER — Emergency Department (HOSPITAL_COMMUNITY): Payer: BLUE CROSS/BLUE SHIELD

## 2020-05-02 ENCOUNTER — Encounter (HOSPITAL_COMMUNITY): Payer: Self-pay | Admitting: Emergency Medicine

## 2020-05-02 ENCOUNTER — Emergency Department (HOSPITAL_COMMUNITY)
Admission: EM | Admit: 2020-05-02 | Discharge: 2020-05-03 | Disposition: A | Discharge status: Home / Self Care | Payer: BLUE CROSS/BLUE SHIELD | Attending: Emergency Medicine | Admitting: Emergency Medicine

## 2020-05-02 DIAGNOSIS — Y929 Unspecified place or not applicable: Secondary | ICD-10-CM | POA: Diagnosis not present

## 2020-05-02 DIAGNOSIS — S01531A Puncture wound without foreign body of lip, initial encounter: Secondary | ICD-10-CM | POA: Diagnosis not present

## 2020-05-02 DIAGNOSIS — Y939 Activity, unspecified: Secondary | ICD-10-CM | POA: Insufficient documentation

## 2020-05-02 DIAGNOSIS — Y999 Unspecified external cause status: Secondary | ICD-10-CM | POA: Insufficient documentation

## 2020-05-02 DIAGNOSIS — W01198A Fall on same level from slipping, tripping and stumbling with subsequent striking against other object, initial encounter: Secondary | ICD-10-CM | POA: Diagnosis not present

## 2020-05-02 IMAGING — CT CT HEAD W/O CM
4 series · 16 of 47 positions shown, 18 images · non-contrast
Comparison: [DATE]

CLINICAL DATA: Head trauma, headache, fall

EXAM:
CT HEAD WITHOUT CONTRAST
TECHNIQUE: Contiguous axial images were obtained from the base of the skull
through the vertex without intravenous contrast.

[Series 3: head without · axial · non-contrast · 0.47mm/px · z∈[+120,+240]mm · 7 of 34 slices shown, 9 images]
[im 5/34  brain]
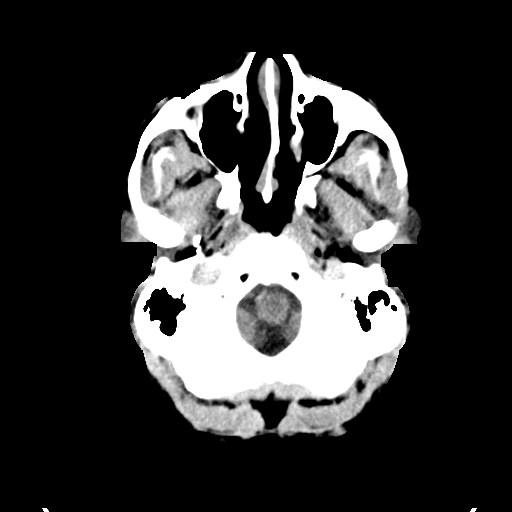
[im 5/34  bone]
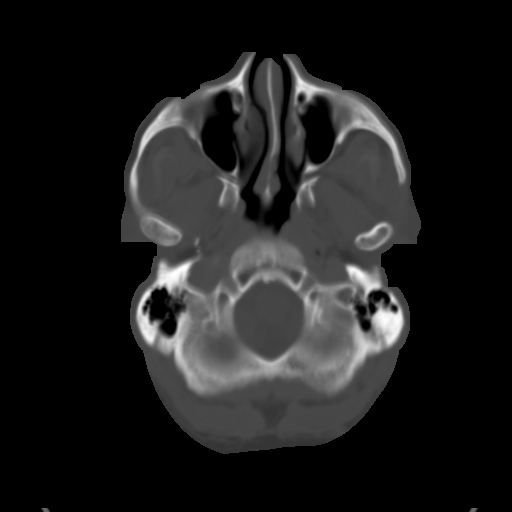
[im 9/34  brain]
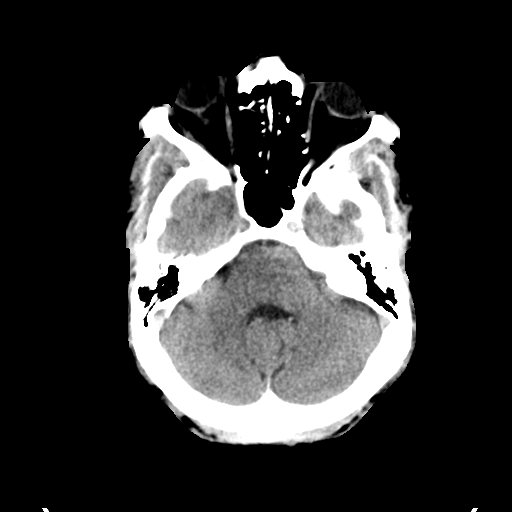
[im 13/34  brain]
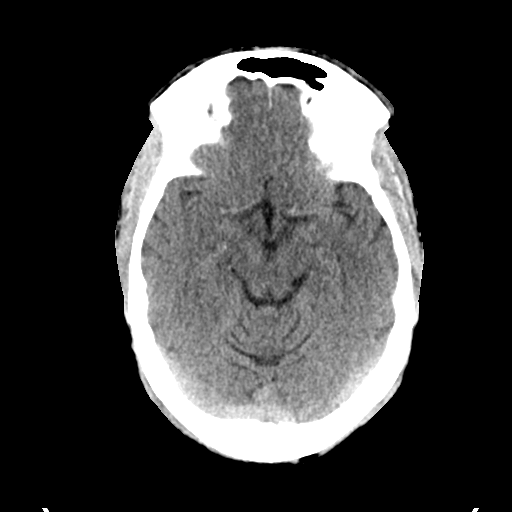
[im 17/34  brain]
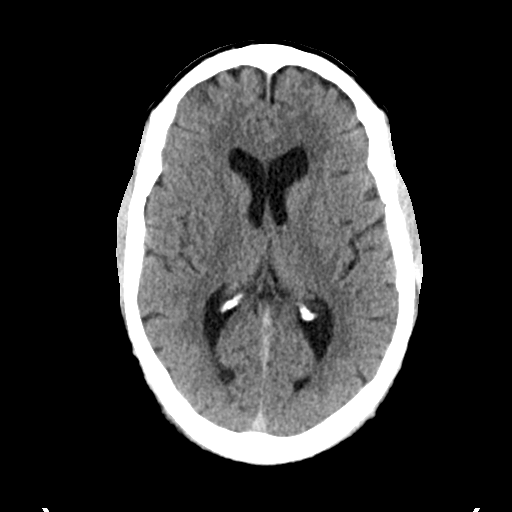
[im 21/34  brain]
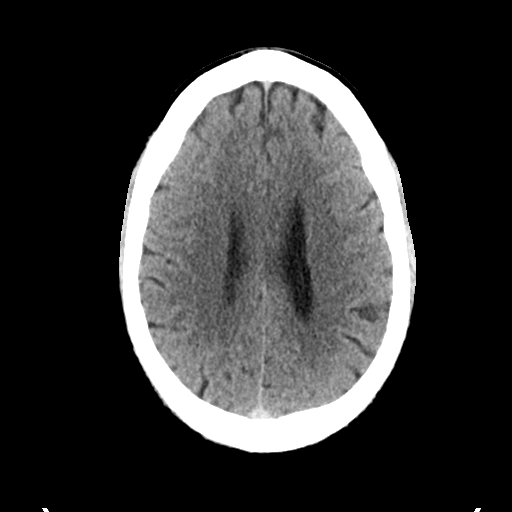
[im 21/34  bone]
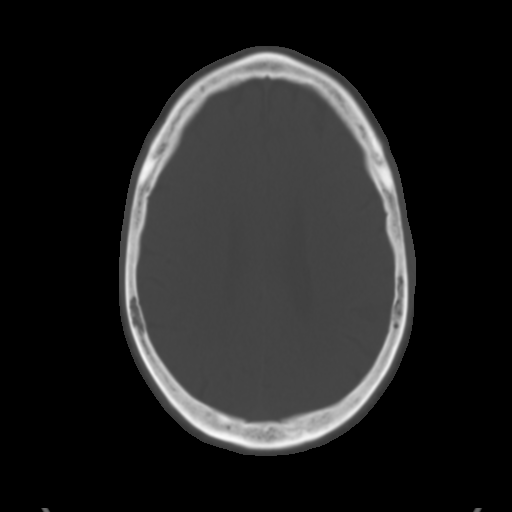
[im 25/34  brain]
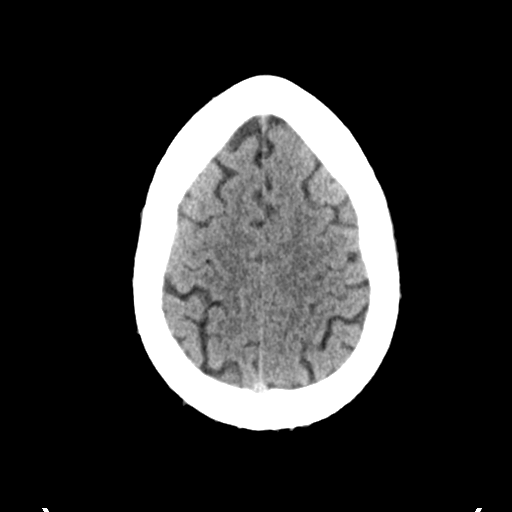
[im 29/34  brain]
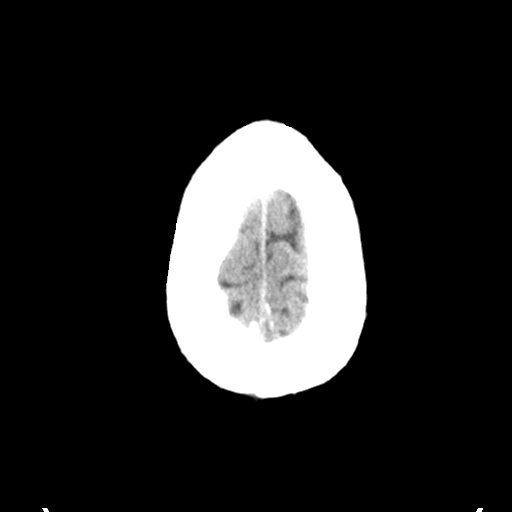

[Series 4: head bone · axial · 0.47mm/px · z∈[+116,+150]mm · 3 of 85 slices shown]
[im 9/85  bone]
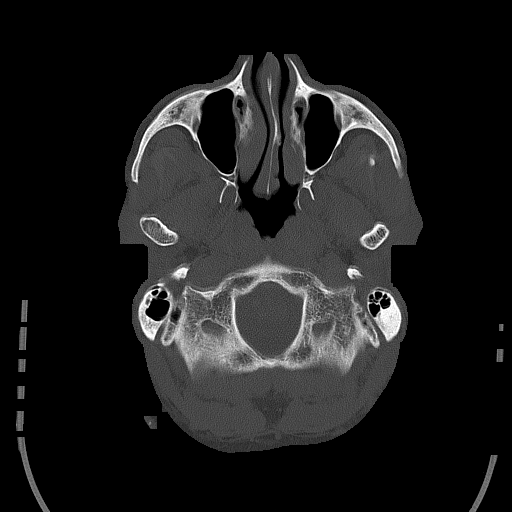
[im 17/85  bone]
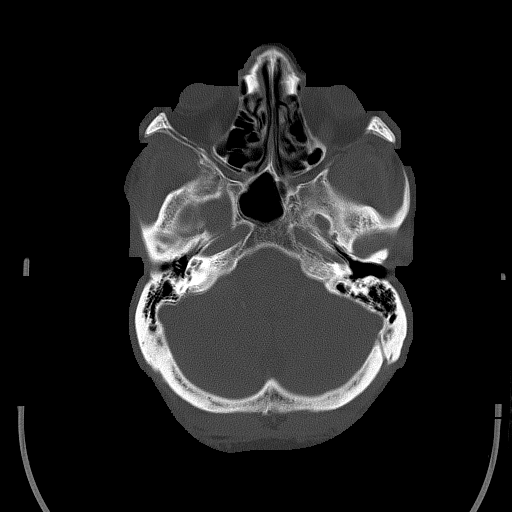
[im 26/85  bone]
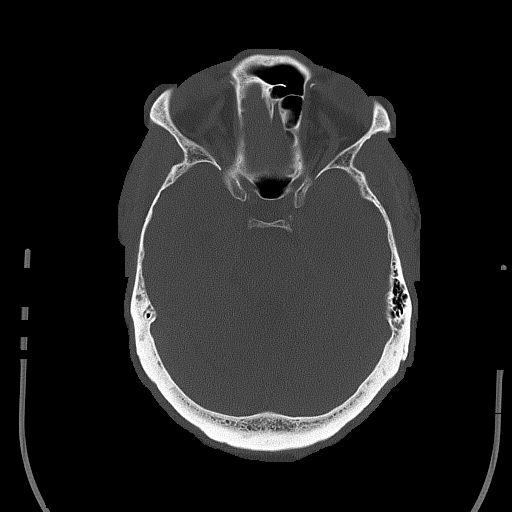

[Series 5: head without cor · coronal · non-contrast · 0.35mm/px · 3 of 77 slices shown]
[im 26/77  brain]
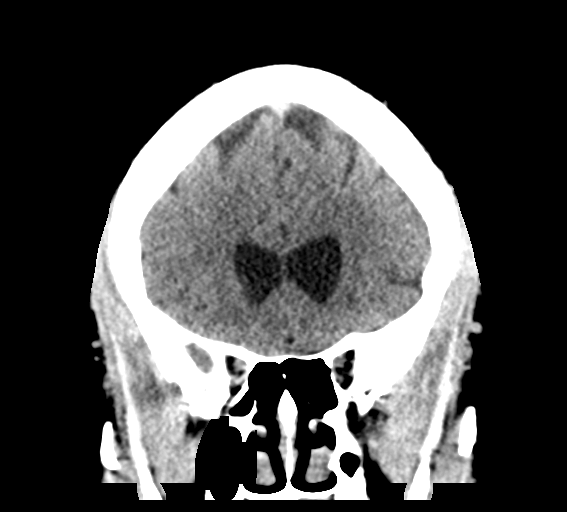
[im 34/77  brain]
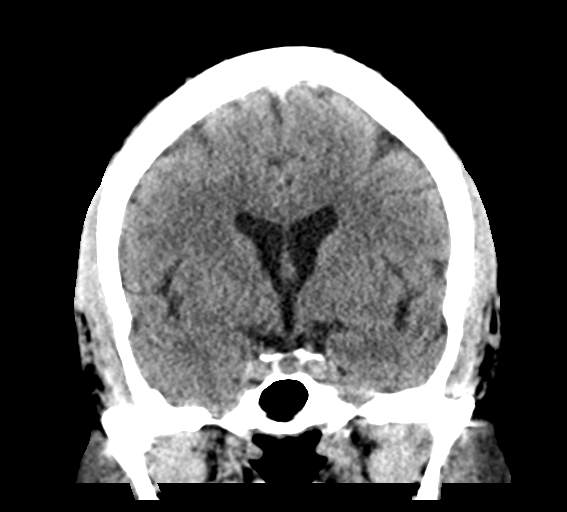
[im 43/77  brain]
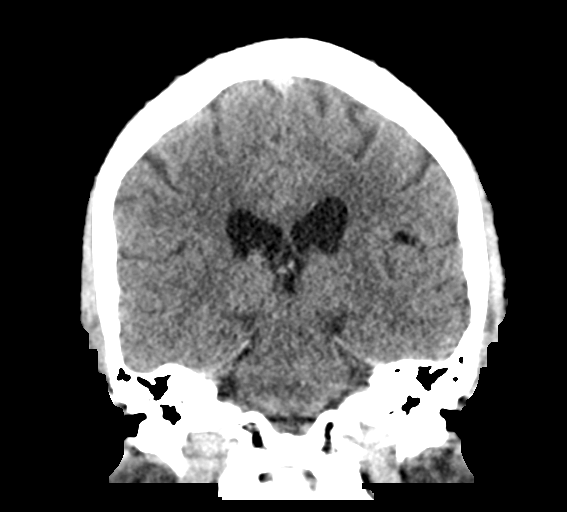

[Series 6: head without sag · sagittal · non-contrast · 0.35mm/px · 3 of 67 slices shown]
[im 23/67  brain]
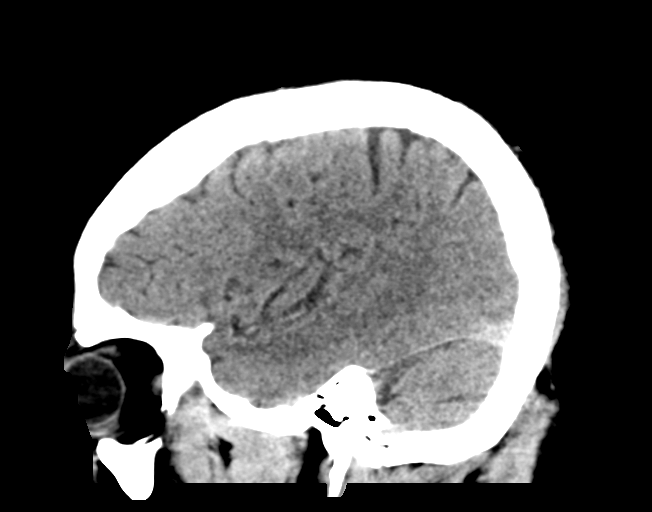
[im 34/67  brain]
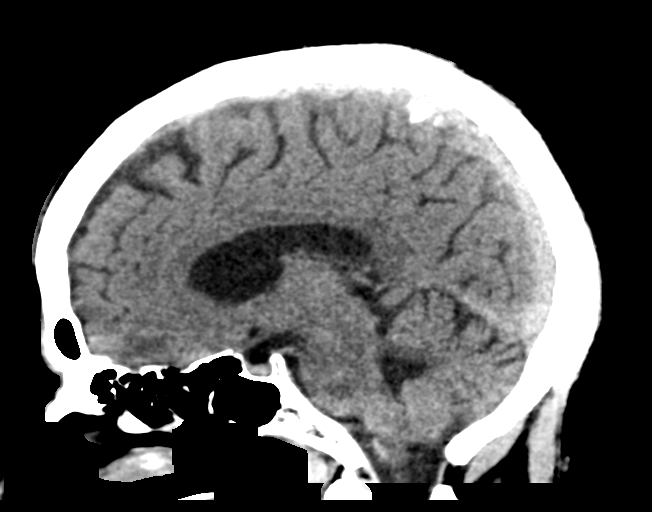
[im 45/67  brain]
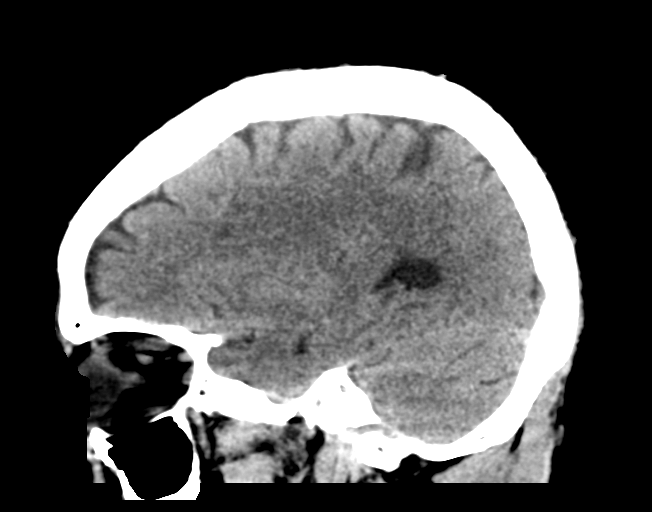

[16 of 47 positions shown; findings below may reference images not displayed]

FINDINGS: Brain: No acute intracranial abnormality. Specifically, no
hemorrhage, hydrocephalus, mass lesion, acute infarction, or
significant intracranial injury.

Vascular: No hyperdense vessel or unexpected calcification.

Skull: No acute calvarial abnormality.

Sinuses/Orbits: Visualized paranasal sinuses and mastoids clear.
Orbital soft tissues unremarkable.

Other: None
IMPRESSION: Normal study.

## 2020-05-02 NOTE — ED Triage Notes (Addendum)
Patient fell down a flight of steps and hit his head on the steps.  Patient is CAOx4, patient does have a small wound to back of head, bleeding controlled.  Patient denies any blood thinner use.  No LOC.  Patient also having left hand tenderness.

## 2020-05-03 NOTE — ED Notes (Signed)
No answer x2 

## 2020-11-23 ENCOUNTER — Other Ambulatory Visit: Payer: Self-pay

## 2020-11-23 ENCOUNTER — Encounter: Payer: Self-pay | Admitting: Cardiology

## 2020-11-23 ENCOUNTER — Ambulatory Visit (INDEPENDENT_AMBULATORY_CARE_PROVIDER_SITE_OTHER): Payer: BLUE CROSS/BLUE SHIELD | Admitting: Cardiology

## 2020-11-23 VITALS — BP 155/89 | HR 99 | Temp 98.1°F | Ht 67.0 in | Wt 206.0 lb

## 2020-11-23 DIAGNOSIS — Z79899 Other long term (current) drug therapy: Secondary | ICD-10-CM

## 2020-11-23 DIAGNOSIS — Z716 Tobacco abuse counseling: Secondary | ICD-10-CM | POA: Diagnosis not present

## 2020-11-23 DIAGNOSIS — Z01812 Encounter for preprocedural laboratory examination: Secondary | ICD-10-CM | POA: Diagnosis not present

## 2020-11-23 DIAGNOSIS — R072 Precordial pain: Secondary | ICD-10-CM | POA: Diagnosis not present

## 2020-11-23 DIAGNOSIS — E785 Hyperlipidemia, unspecified: Secondary | ICD-10-CM

## 2020-11-23 DIAGNOSIS — I10 Essential (primary) hypertension: Secondary | ICD-10-CM

## 2020-11-23 MED ORDER — METOPROLOL TARTRATE 100 MG PO TABS: ORAL_TABLET | ORAL | 0 refills | Status: DC

## 2020-11-23 NOTE — Progress Notes (Signed)
Cardiology Office Note:    Date:  11/23/2020   ID:  Ralph Bradshaw, DOB [birth year], MRN [id number]  PCP:  Suella Broad, FNP  Cardiologist:  Buford Dresser, MD  Referring MD: Suella Broad   CC: new patient consultation for chest pain  History of Present Illness:    Ralph Bradshaw is a 64 y.o. male with a hx of hypertension, hyperlipidemia, hyperthyroidism who is seen as a new consult at the request of Burt Ek, Takia S,* for the evaluation and management of chest pain.  I do not have a copy of recent records today. I have no prior labs available.  Chest pain: -Initial onset: at least several years ago -Quality: central chest, can be both sharp and dull. Largely central but can also move to the left. -Frequency: will come for a day, then go away. Has been getting more frequent, now about once/week. Had an episode last night to this AM when he woke up with chest dullness. -Duration: can last as long as all day on occasion. -Associated symptoms: none -Aggravating/alleviating factors: none that he has found. Not related to exertion or food. -Prior cardiac history: none -Prior workup: ETT 04/20/2011, normal. -Prior treatment: none -Alcohol: 1 beer/day on the weekdays, more on the weekends. Drinks at least a 6 pack/day on the weekend. -Tobacco: current, smokes more on the weekend (2.5 packs/weekend) and 1/2 ppd during the week.  -Comorbidities: high cholesterol, treated for the two years. High blood pressure since his late 61s. Hyperthyroidism, on methimazole.  -Exercise level: can be active, do outside yard work. -Cardiac ROS: no shortness of breath, no PND, no orthopnea, no LE edema, no syncope -Family history: mother recently diagnosed with heart disease (currently living at age 66). No one else that he knows of.   Past Medical History:  Diagnosis Date  . Hypertension   . Hyperthyroidism     Past Surgical History:  Procedure Laterality Date   . LYMPH NODE BIOPSY Left    shoulder    Current Medications: Current Outpatient Medications on File Prior to Visit  Medication Sig  . atorvastatin (LIPITOR) 20 MG tablet Take 20 mg by mouth daily.  . methimazole (TAPAZOLE) 10 MG tablet Take 10 mg by mouth 2 (two) times daily.  . metoprolol succinate (TOPROL-XL) 25 MG 24 hr tablet Take 25 mg by mouth daily.  . traMADol (ULTRAM) 50 MG tablet Take 1 tablet (50 mg total) by mouth every 6 (six) hours as needed for severe pain.   No current facility-administered medications on file prior to visit.     Allergies:   Patient has no known allergies.   Social History   Tobacco Use  . Smoking status: Current Every Day Smoker    Packs/day: 0.50    Types: Cigarettes  . Smokeless tobacco: Never Used  Vaping Use  . Vaping Use: Never used  Substance Use Topics  . Alcohol use: Yes    Alcohol/week: 7.0 standard drinks    Types: 7 Cans of beer per week  . Drug use: Never    Family History: family history includes Diabetes in his mother.  ROS:   Please see the history of present illness.  Additional pertinent ROS: Constitutional: Negative for chills, fever, night sweats, unintentional weight loss  HENT: Negative for ear pain and hearing loss.   Eyes: Negative for loss of vision and eye pain.  Respiratory: Negative for cough, sputum, wheezing.   Cardiovascular: See HPI. Gastrointestinal: Negative for abdominal pain, melena, and hematochezia.  Genitourinary:  Negative for dysuria and hematuria.  Musculoskeletal: Negative for falls and myalgias.  Skin: Negative for itching and rash.  Neurological: Negative for focal weakness, focal sensory changes and loss of consciousness.  Endo/Heme/Allergies: Does not bruise/bleed easily.     EKGs/Labs/Other Studies Reviewed:    The following studies were reviewed today: ETT 2011-04-25 Comments: Stage I advanced after 1:30 so total Bruce time 12:00. Normal exercise stress test with no evidence of  ischemia. Excellent exercise tolerance. Mild HTN response to exercise.  EKG:  EKG is personally reviewed.  The ekg ordered today demonstrates NSR with nonspecific ST pattern at 99 bpm  Recent Labs: No results found for requested labs within last 8760 hours.  Recent Lipid Panel    Component Value Date/Time   CHOL 184 04/18/2011 2153   TRIG 59 04/18/2011 2153   HDL 61 04/18/2011 2153   CHOLHDL 3.0 04/18/2011 2153   VLDL 12 04/18/2011 2153   LDLCALC (H) 04/18/2011 2153    111        Total Cholesterol/HDL:CHD Risk Coronary Heart Disease Risk Table                     Men   Women  1/2 Average Risk   3.4   3.3  Average Risk       5.0   4.4  2 X Average Risk   9.6   7.1  3 X Average Risk  23.4   11.0        Use the calculated Patient Ratio above and the CHD Risk Table to determine the patient's CHD Risk.        ATP III CLASSIFICATION (LDL):  <100     mg/dL   Optimal  100-129  mg/dL   Near or Above                    Optimal  130-159  mg/dL   Borderline  160-189  mg/dL   High  >190     mg/dL   Very High    Physical Exam:    VS:  BP (!) 155/89   Pulse 99   Temp 98.1 F (36.7 C)   Ht 5' 7"  (1.702 m)   Wt 206 lb (93.4 kg)   SpO2 90%   BMI 32.26 kg/m     Wt Readings from Last 3 Encounters:  11/23/20 206 lb (93.4 kg)  04/24/20 195 lb (88.5 kg)    GEN: Well nourished, well developed in no acute distress HEENT: Normal, moist mucous membranes NECK: No JVD CARDIAC: regular rhythm, normal S1 and S2, no rubs or gallops. No murmur. VASCULAR: Radial and DP pulses 2+ bilaterally. No carotid bruits RESPIRATORY:  Clear to auscultation without rales, wheezing or rhonchi  ABDOMEN: Soft, non-tender, non-distended MUSCULOSKELETAL:  Ambulates independently SKIN: Warm and dry, no edema NEUROLOGIC:  Alert and oriented x 3. No focal neuro deficits noted. PSYCHIATRIC:  Normal affect    ASSESSMENT:    1. Precordial pain   2. Medication management   3. Pre-procedure lab exam   4.  Essential hypertension   5. Hyperlipidemia, unspecified hyperlipidemia type   6. Tobacco abuse counseling    PLAN:    Chest pain: -discussed treadmill stress, nuclear stress/lexiscan, and CT coronary angiography. Discussed pros and cons of each, including but not limited to false positive/false negative risk, radiation risk, and risk of IV contrast dye. Based on shared decision making, decision was made to pursue CT coronary angiography. -counseled on  need to get BMET prior to test -counseled on use of sublingual nitroglycerin and its importance to a good test -continue metoprolol succinate but add one time dose of metoprolol tartrate 100 mg day of test to get heart rate to goal.  Hypertension -elevated today -may need to add additional medication if remains elevated. He has upcoming follow up with his PCP in about two weeks to address this  Reported high cholesterol -check lipids today  Tobacco use and cessation counseling: The patient was counseled on tobacco cessation today for 4 minutes.  Counseling included reviewing the risks of smoking tobacco products, how it impacts the patient's current medical diagnoses and different strategies for quitting.  Pharmacotherapy to aid in tobacco cessation was not prescribed today.  Cardiac risk counseling and prevention recommendations: -recommend heart healthy/Mediterranean diet, with whole grains, fruits, vegetable, fish, lean meats, nuts, and olive oil. Limit salt. -recommend moderate walking, 3-5 times/week for 30-50 minutes each session. Aim for at least 150 minutes.week. Goal should be pace of 3 miles/hours, or walking 1.5 miles in 30 minutes -recommend avoidance of tobacco products. Avoid excess alcohol. -ASCVD risk score: The ASCVD Risk score Mikey Bussing DC Jr., et al., 2013) failed to calculate for the following reasons:   Cannot find a previous HDL lab   Cannot find a previous total cholesterol lab    Plan for follow up: tbd based on results  of scan  Buford Dresser, MD, PhD [place]  [facility]    Medication Adjustments/Labs and Tests Ordered: Current medicines are reviewed at length with the patient today.  Concerns regarding medicines are outlined above.  Orders Placed This Encounter  Procedures  . CT CORONARY MORPH W/CTA COR W/SCORE W/CA W/CM &/OR WO/CM  . CT CORONARY FRACTIONAL FLOW RESERVE DATA PREP  . CT CORONARY FRACTIONAL FLOW RESERVE FLUID ANALYSIS  . Basic metabolic panel  . Lipid panel  . EKG 12-Lead   Meds ordered this encounter  Medications  . metoprolol tartrate (LOPRESSOR) 100 MG tablet    Sig: TAKE 1 TABLET 2 HR PRIOR TO CARDIAC PROCEDURE    Dispense:  1 tablet    Refill:  0    Patient Instructions  Medication Instructions:  Your Physician recommend you continue on your current medication as directed.    *If you need a refill on your cardiac medications before your next appointment, please call your pharmacy*   Lab Work: Your physician recommends lab work today (BMP, Lipid)  If you have labs (blood work) drawn today and your tests are completely normal, you will receive your results only by: Marland Kitchen MyChart Message (if you have MyChart) OR . A paper copy in the mail If you have any lab test that is abnormal or we need to change your treatment, we will call you to review the results.   Testing/Procedures: Cardiac CT Angiography (CTA), is a special type of CT scan that uses a computer to produce multi-dimensional views of major blood vessels throughout the body. In CT angiography, a contrast material is injected through an IV to help visualize the blood vessels [facility]   Follow-Up: At [facility], you and your health needs are our priority.  As part of our continuing mission to provide you with exceptional heart care, we have created designated Provider Care Teams.  These Care Teams include your primary Cardiologist (physician) and Advanced Practice Providers (APPs -   Physician Assistants and Nurse Practitioners) who all work together to provide you with the care you need,  when you need it.  We recommend signing up for the patient portal called "MyChart".  Sign up information is provided on this After Visit Summary.  MyChart is used to connect with patients for Virtual Visits (Telemedicine).  Patients are able to view lab/test results, encounter notes, upcoming appointments, etc.  Non-urgent messages can be sent to your provider as well.   To learn more about what you can do with MyChart, go to [email or web address].    Your next appointment:   Based on test results  The format for your next appointment:   In Person  Provider:   Buford Dresser, MD  Your cardiac CT will be scheduled at one of the below locations:   [facility] [address] [place], [place] [address] [phone]   If scheduled at [facility], please arrive at the [facility] main entrance of [facility] 30 minutes prior to test start time. Proceed to the [facility] Radiology Department (first floor) to check-in and test prep.  If scheduled at [facility], please arrive 15 mins early for check-in and test prep.  Please follow these instructions carefully (unless otherwise directed):  Hold all erectile dysfunction medications at least 3 days (72 hrs) prior to test.  On the Night Before the Test: . Be sure to Drink plenty of water. . Do not consume any caffeinated/decaffeinated beverages or chocolate 12 hours prior to your test. . Do not take any antihistamines 12 hours prior to your test.   On the Day of the Test: . Drink plenty of water. Do not drink any water within one hour of the test. . Do not eat any food 4 hours prior to the test. . You may take your regular medications prior to the test.  . Take regular dose of metoprolol succinate 25 mg as normal then tak metoprolol (Lopressor) 100 mg two hours prior  to test.      After the Test: . Drink plenty of water. . After receiving IV contrast, you may experience a mild flushed feeling. This is normal. . On occasion, you may experience a mild rash up to 24 hours after the test. This is not dangerous. If this occurs, you can take Benadryl 25 mg and increase your fluid intake. . If you experience trouble breathing, this can be serious. If it is severe call 911 IMMEDIATELY. If it is mild, please call our office. . If you take any of these medications: Glipizide/Metformin, Avandament, Glucavance, please do not take 48 hours after completing test unless otherwise instructed.   Once we have confirmed authorization from your insurance company, we will call you to set up a date and time for your test. Based on how quickly your insurance processes prior authorizations requests, please allow up to 4 weeks to be contacted for scheduling your Cardiac CT appointment. Be advised that routine Cardiac CT appointments could be scheduled as many as 8 weeks after your provider has ordered it.  For non-scheduling related questions, please contact the cardiac imaging nurse navigator should you have any questions/concerns: Marchia Bond, Cardiac Imaging Nurse Navigator Burley Saver, Interim Cardiac Imaging Nurse [place] and Vascular Services Direct Office Dial: [phone]   For scheduling needs, including cancellations and rescheduling, please call [place], [phone].     Signed, Buford Dresser, MD PhD 11/23/2020 4:54 PM    [place]

## 2020-11-23 NOTE — Patient Instructions (Addendum)
Medication Instructions:  Your Physician recommend you continue on your current medication as directed.    *If you need a refill on your cardiac medications before your next appointment, please call your pharmacy*   Lab Work: Your physician recommends lab work today (BMP, Lipid)  If you have labs (blood work) drawn today and your tests are completely normal, you will receive your results only by: Marland Kitchen MyChart Message (if you have MyChart) OR . A paper copy in the mail If you have any lab test that is abnormal or we need to change your treatment, we will call you to review the results.   Testing/Procedures: Cardiac CT Angiography (CTA), is a special type of CT scan that uses a computer to produce multi-dimensional views of major blood vessels throughout the body. In CT angiography, a contrast material is injected through an IV to help visualize the blood vessels [facility]   Follow-Up: At [facility], you and your health needs are our priority.  As part of our continuing mission to provide you with exceptional heart care, we have created designated Provider Care Teams.  These Care Teams include your primary Cardiologist (physician) and Advanced Practice Providers (APPs -  Physician Assistants and Nurse Practitioners) who all work together to provide you with the care you need, when you need it.  We recommend signing up for the patient portal called "MyChart".  Sign up information is provided on this After Visit Summary.  MyChart is used to connect with patients for Virtual Visits (Telemedicine).  Patients are able to view lab/test results, encounter notes, upcoming appointments, etc.  Non-urgent messages can be sent to your provider as well.   To learn more about what you can do with MyChart, go to [email or web address].    Your next appointment:   Based on test results  The format for your next appointment:   In Person  Provider:   Buford Dresser, MD  Your cardiac  CT will be scheduled at one of the below locations:   [facility] [address] [place], [place] [address] [phone]   If scheduled at [facility], please arrive at the [facility] main entrance of [facility] 30 minutes prior to test start time. Proceed to the [facility] Radiology Department (first floor) to check-in and test prep.  If scheduled at [facility], please arrive 15 mins early for check-in and test prep.  Please follow these instructions carefully (unless otherwise directed):  Hold all erectile dysfunction medications at least 3 days (72 hrs) prior to test.  On the Night Before the Test: . Be sure to Drink plenty of water. . Do not consume any caffeinated/decaffeinated beverages or chocolate 12 hours prior to your test. . Do not take any antihistamines 12 hours prior to your test.   On the Day of the Test: . Drink plenty of water. Do not drink any water within one hour of the test. . Do not eat any food 4 hours prior to the test. . You may take your regular medications prior to the test.  . Take regular dose of metoprolol succinate 25 mg as normal then tak metoprolol (Lopressor) 100 mg two hours prior to test.      After the Test: . Drink plenty of water. . After receiving IV contrast, you may experience a mild flushed feeling. This is normal. . On occasion, you may experience a mild rash up to 24 hours after the test. This is  not dangerous. If this occurs, you can take Benadryl 25 mg and increase your fluid intake. . If you experience trouble breathing, this can be serious. If it is severe call 911 IMMEDIATELY. If it is mild, please call our office. . If you take any of these medications: Glipizide/Metformin, Avandament, Glucavance, please do not take 48 hours after completing test unless otherwise instructed.   Once we have confirmed authorization from your insurance company, we will call you to set up a  date and time for your test. Based on how quickly your insurance processes prior authorizations requests, please allow up to 4 weeks to be contacted for scheduling your Cardiac CT appointment. Be advised that routine Cardiac CT appointments could be scheduled as many as 8 weeks after your provider has ordered it.  For non-scheduling related questions, please contact the cardiac imaging nurse navigator should you have any questions/concerns: Marchia Bond, Cardiac Imaging Nurse Navigator Burley Saver, Interim Cardiac Imaging Nurse [place] and Vascular Services Direct Office Dial: [phone]   For scheduling needs, including cancellations and rescheduling, please call [place], [phone].

## 2020-11-24 LAB — BASIC METABOLIC PANEL
BUN/Creatinine Ratio: 16 (ref 10–24)
BUN: 19 mg/dL (ref 8–27)
CO2: 26 mmol/L (ref 20–29)
Calcium: 10.2 mg/dL (ref 8.6–10.2)
Chloride: 103 mmol/L (ref 96–106)
Creatinine, Ser: 1.17 mg/dL (ref 0.76–1.27)
GFR calc Af Amer: 77 mL/min/{1.73_m2} (ref >59)
GFR calc non Af Amer: 66 mL/min/{1.73_m2} (ref >59)
Glucose: 86 mg/dL (ref 65–99)
Potassium: 4.5 mmol/L (ref 3.5–5.2)
Sodium: 144 mmol/L (ref 134–144)

## 2020-11-24 LAB — LIPID PANEL
Chol/HDL Ratio: 3.8 ratio (ref 0.0–5.0)
Cholesterol, Total: 211 mg/dL — ABNORMAL HIGH (ref 100–199)
HDL: 55 mg/dL (ref >39)
LDL Chol Calc (NIH): 134 mg/dL — ABNORMAL HIGH (ref 0–99)
Triglycerides: 125 mg/dL (ref 0–149)
VLDL Cholesterol Cal: 22 mg/dL (ref 5–40)

## 2020-12-21 ENCOUNTER — Telehealth (HOSPITAL_COMMUNITY): Payer: Self-pay | Admitting: Emergency Medicine

## 2020-12-21 ENCOUNTER — Telehealth (HOSPITAL_COMMUNITY): Payer: Self-pay | Admitting: *Deleted

## 2020-12-21 NOTE — Telephone Encounter (Signed)
Pt returning phone call regarding upcoming cardiac imaging study; pt verbalizes understanding of appt date/time, parking situation and where to check in, pre-test NPO status and medications ordered, and verified current allergies; name and call back number provided for further questions should they arise Ralph Bradshaw [place] RN Navigator Cardiac Imaging Ralph Bradshaw [facility] [phone] office [phone] cell   Pt reminded to pick up one time dose metoprolol to take 2 hr prior to scan. Pt instructed to HOLD daily TOPROL XL and will take 100mg  metoprolol tartrate 2 hr PTA. Pt verbalized understanding. 

## 2020-12-21 NOTE — Telephone Encounter (Signed)
Attempted to call patient regarding upcoming cardiac CT appointment. Left message with receptionist with name and callback number  Larey Brick RN Navigator Cardiac Imaging [facility] Heart and Vascular Services [phone] Office [phone] Cell

## 2020-12-22 ENCOUNTER — Ambulatory Visit (HOSPITAL_COMMUNITY)
Admission: RE | Admit: 2020-12-22 | Discharge: 2020-12-22 | Disposition: A | Discharge status: Home / Self Care | Payer: BLUE CROSS/BLUE SHIELD | Source: Ambulatory Visit | Attending: Cardiology | Admitting: Cardiology

## 2020-12-22 ENCOUNTER — Other Ambulatory Visit: Payer: Self-pay

## 2020-12-22 DIAGNOSIS — R072 Precordial pain: Secondary | ICD-10-CM

## 2020-12-22 MED ORDER — METOPROLOL TARTRATE 5 MG/5ML IV SOLN
INTRAVENOUS | Status: AC
  Filled 2020-12-22: qty 5

## 2020-12-22 MED ORDER — NITROGLYCERIN 0.4 MG SL SUBL: 0.8000 mg | SUBLINGUAL_TABLET | Freq: Once | SUBLINGUAL | Status: AC

## 2020-12-22 MED ORDER — IOHEXOL 350 MG/ML SOLN
80.0000 mL | Freq: Once | INTRAVENOUS | Status: AC | PRN
  Administered 2020-12-22: 80 mL via INTRAVENOUS

## 2020-12-22 MED ORDER — NITROGLYCERIN 0.4 MG SL SUBL
SUBLINGUAL_TABLET | SUBLINGUAL | Status: AC
  Administered 2020-12-22: 0.8 mg via SUBLINGUAL
  Filled 2020-12-22: qty 2

## 2020-12-28 ENCOUNTER — Telehealth: Payer: Self-pay | Admitting: Cardiology

## 2020-12-28 NOTE — Telephone Encounter (Signed)
Patient states he is returning a call from today. However, he is not sure who called or what the call was in reference to. The call may have been regarding recent lab results or CT, however, nothing has been documented today. Please advise.

## 2020-12-28 NOTE — Telephone Encounter (Signed)
Attempted to call back. Unable to leave message as mailbox is not set up.

## 2020-12-30 NOTE — Telephone Encounter (Signed)
Second attempt to call back. Unable to leave message, VM not set up.

## 2021-01-02 NOTE — Telephone Encounter (Signed)
Called patient, advised that he had received his results via mail and had no questions.

## 2021-01-06 ENCOUNTER — Other Ambulatory Visit: Payer: Self-pay

## 2021-01-06 MED ORDER — ASPIRIN EC 81 MG PO TBEC: 81.0000 mg | DELAYED_RELEASE_TABLET | Freq: Every day | ORAL | 3 refills | Status: AC

## 2021-01-11 NOTE — Progress Notes (Signed)
Thank you. Can we ask him to increase his atorvastatin from 20 mg to 40 mg daily and recheck lipids in 2 months? Thanks
